# Patient Record
Sex: Male | Born: 1950
Health system: Southern US, Community
[De-identification: ages and names within clinical notes are randomized; demographics above are authoritative.]

## PROBLEM LIST (undated history)

## (undated) DIAGNOSIS — E7800 Pure hypercholesterolemia, unspecified: Secondary | ICD-10-CM

## (undated) DIAGNOSIS — I10 Essential (primary) hypertension: Secondary | ICD-10-CM

## (undated) DIAGNOSIS — E119 Type 2 diabetes mellitus without complications: Secondary | ICD-10-CM

## (undated) HISTORY — PX: OTHER SURGICAL HISTORY: SHX169

## (undated) HISTORY — PX: AORTIC VALVE REPLACEMENT (AVR)/CORONARY ARTERY BYPASS GRAFTING (CABG): SHX5725

---

## 2019-11-20 ENCOUNTER — Ambulatory Visit: Payer: Self-pay | Admitting: Podiatry

## 2019-11-29 ENCOUNTER — Other Ambulatory Visit: Payer: Self-pay

## 2019-11-29 ENCOUNTER — Telehealth: Payer: Self-pay

## 2019-11-29 DIAGNOSIS — Z1211 Encounter for screening for malignant neoplasm of colon: Secondary | ICD-10-CM

## 2019-11-29 MED ORDER — NA SULFATE-K SULFATE-MG SULF 17.5-3.13-1.6 GM/177ML PO SOLN: 1.0000 | Freq: Once | ORAL | 0 refills | Status: AC

## 2019-11-29 NOTE — Telephone Encounter (Signed)
Gastroenterology Pre-Procedure Review  Request Date: 01/12/20 Requesting Physician: Dr. Vicente Males  PATIENT REVIEW QUESTIONS: The patient responded to the following health history questions as indicated:    1. Are you having any GI issues? no 2. Do you have a personal history of Polyps? no 3. Do you have a family history of Colon Cancer or Polyps? no 4. Diabetes Mellitus? yes (type 2 oral meds) 5. Joint replacements in the past 12 months?no 6. Major health problems in the past 3 months?no 7. Any artificial heart valves, MVP, or defibrillator?no    MEDICATIONS & ALLERGIES:    Patient reports the following regarding taking any anticoagulation/antiplatelet therapy:   Plavix, Coumadin, Eliquis, Xarelto, Lovenox, Pradaxa, Brilinta, or Effient? no Aspirin? yes (81 mg daily)  Patient confirms/reports the following medications:  Current Outpatient Medications  Medication Sig Dispense Refill  . aspirin EC 81 MG tablet Take 81 mg by mouth daily.    Marland Kitchen gabapentin (NEURONTIN) 100 MG capsule     . isosorbide mononitrate (IMDUR) 60 MG 24 hr tablet     . metFORMIN (GLUCOPHAGE) 500 MG tablet     . metoprolol tartrate (LOPRESSOR) 25 MG tablet     . Na Sulfate-K Sulfate-Mg Sulf 17.5-3.13-1.6 GM/177ML SOLN Take 1 kit by mouth once for 1 dose. 354 mL 0  . oxyCODONE-acetaminophen (PERCOCET) 10-325 MG tablet     . rOPINIRole (REQUIP) 0.5 MG tablet      No current facility-administered medications for this visit.    Patient confirms/reports the following allergies:  Allergies  Allergen Reactions  . Penicillins     Patient states "Does something to him"    No orders of the defined types were placed in this encounter.   AUTHORIZATION INFORMATION Primary Insurance: 1D#: Group #:  Secondary Insurance: 1D#: Group #:  SCHEDULE INFORMATION: Date: 01/12/20 Time: Location:[facility]

## 2019-12-26 ENCOUNTER — Other Ambulatory Visit: Payer: Self-pay

## 2020-01-10 ENCOUNTER — Telehealth: Payer: Self-pay

## 2020-01-10 NOTE — Telephone Encounter (Signed)
Patients call has been returned.  He inquired on the date for his COVID test.  Patient has been advised that he needs to have his COVID test on Wed 01/24/20 at 10:30am.  Thanks,  Marcelino Duster, CMA

## 2020-01-22 ENCOUNTER — Telehealth: Payer: Self-pay | Admitting: Gastroenterology

## 2020-01-22 MED ORDER — NA SULFATE-K SULFATE-MG SULF 17.5-3.13-1.6 GM/177ML PO SOLN: 1.0000 | Freq: Once | ORAL | 0 refills | Status: AC

## 2020-01-22 NOTE — Telephone Encounter (Signed)
Pt left vm to change his apt to  Monday due to his ride please call pt

## 2020-01-22 NOTE — Telephone Encounter (Signed)
Patients colonoscopy has been rescheduled from 01/26/20 to 02/12/20 with Dr. Tobi Bastos still at [facility].  Pt notified of COVID test date Thursday 02/08/20.   New instructions sent to patient via mail.  Referral updated.  Thanks,  [place], [place]

## 2020-02-08 ENCOUNTER — Other Ambulatory Visit: Payer: Self-pay

## 2020-02-08 ENCOUNTER — Other Ambulatory Visit
Admission: RE | Admit: 2020-02-08 | Discharge: 2020-02-08 | Disposition: A | Discharge status: Home / Self Care | Payer: Medicare HMO | Source: Ambulatory Visit | Attending: Gastroenterology | Admitting: Gastroenterology

## 2020-02-08 DIAGNOSIS — Z01812 Encounter for preprocedural laboratory examination: Secondary | ICD-10-CM | POA: Diagnosis present

## 2020-02-08 DIAGNOSIS — Z20822 Contact with and (suspected) exposure to covid-19: Secondary | ICD-10-CM | POA: Diagnosis not present

## 2020-02-08 LAB — SARS CORONAVIRUS 2 (TAT 6-24 HRS): SARS Coronavirus 2: NEGATIVE

## 2020-02-12 ENCOUNTER — Encounter: Payer: Self-pay | Admitting: Registered Nurse

## 2020-02-12 ENCOUNTER — Ambulatory Visit: Admission: RE | Admit: 2020-02-12 | Payer: Medicare HMO | Source: Home / Self Care | Admitting: Gastroenterology

## 2020-02-12 ENCOUNTER — Encounter: Admission: RE | Payer: Self-pay | Source: Home / Self Care

## 2020-02-12 SURGERY — COLONOSCOPY WITH PROPOFOL
Anesthesia: General

## 2020-02-19 ENCOUNTER — Ambulatory Visit: Payer: Medicare HMO

## 2020-04-04 ENCOUNTER — Other Ambulatory Visit: Payer: Self-pay | Admitting: Anesthesiology

## 2020-04-04 DIAGNOSIS — M5416 Radiculopathy, lumbar region: Secondary | ICD-10-CM

## 2020-04-04 DIAGNOSIS — M5440 Lumbago with sciatica, unspecified side: Secondary | ICD-10-CM

## 2020-04-19 ENCOUNTER — Ambulatory Visit
Admission: RE | Admit: 2020-04-19 | Discharge: 2020-04-19 | Disposition: A | Discharge status: Home / Self Care | Payer: Medicare Other | Source: Ambulatory Visit | Attending: Anesthesiology | Admitting: Anesthesiology

## 2020-04-19 ENCOUNTER — Other Ambulatory Visit: Payer: Self-pay

## 2020-04-19 DIAGNOSIS — M5416 Radiculopathy, lumbar region: Secondary | ICD-10-CM | POA: Insufficient documentation

## 2020-04-19 DIAGNOSIS — M5440 Lumbago with sciatica, unspecified side: Secondary | ICD-10-CM | POA: Insufficient documentation

## 2021-02-03 DIAGNOSIS — E1142 Type 2 diabetes mellitus with diabetic polyneuropathy: Secondary | ICD-10-CM | POA: Diagnosis not present

## 2021-02-03 DIAGNOSIS — E782 Mixed hyperlipidemia: Secondary | ICD-10-CM | POA: Diagnosis not present

## 2021-02-03 DIAGNOSIS — M545 Low back pain, unspecified: Secondary | ICD-10-CM | POA: Diagnosis not present

## 2021-02-03 DIAGNOSIS — M109 Gout, unspecified: Secondary | ICD-10-CM | POA: Diagnosis not present

## 2021-02-03 DIAGNOSIS — I10 Essential (primary) hypertension: Secondary | ICD-10-CM | POA: Diagnosis not present

## 2021-02-03 DIAGNOSIS — E1165 Type 2 diabetes mellitus with hyperglycemia: Secondary | ICD-10-CM | POA: Diagnosis not present

## 2021-02-17 DIAGNOSIS — I509 Heart failure, unspecified: Secondary | ICD-10-CM | POA: Diagnosis not present

## 2021-02-17 DIAGNOSIS — I10 Essential (primary) hypertension: Secondary | ICD-10-CM | POA: Diagnosis not present

## 2021-02-17 DIAGNOSIS — R1084 Generalized abdominal pain: Secondary | ICD-10-CM | POA: Diagnosis not present

## 2021-02-17 DIAGNOSIS — I2510 Atherosclerotic heart disease of native coronary artery without angina pectoris: Secondary | ICD-10-CM | POA: Diagnosis not present

## 2021-02-17 DIAGNOSIS — E1142 Type 2 diabetes mellitus with diabetic polyneuropathy: Secondary | ICD-10-CM | POA: Diagnosis not present

## 2021-03-04 ENCOUNTER — Emergency Department: Payer: Medicare Other

## 2021-03-04 ENCOUNTER — Other Ambulatory Visit: Payer: Self-pay

## 2021-03-04 ENCOUNTER — Encounter
Admission: EM | Disposition: A | Discharge status: Home / Self Care | Payer: Self-pay | Source: Home / Self Care | Attending: Emergency Medicine

## 2021-03-04 ENCOUNTER — Observation Stay
Admission: EM | Admit: 2021-03-04 | Discharge: 2021-03-04 | Disposition: A | Discharge status: Home / Self Care | Payer: Medicare Other | Attending: Internal Medicine | Admitting: Internal Medicine

## 2021-03-04 DIAGNOSIS — I10 Essential (primary) hypertension: Secondary | ICD-10-CM | POA: Diagnosis not present

## 2021-03-04 DIAGNOSIS — E119 Type 2 diabetes mellitus without complications: Secondary | ICD-10-CM | POA: Diagnosis not present

## 2021-03-04 DIAGNOSIS — I200 Unstable angina: Secondary | ICD-10-CM | POA: Diagnosis not present

## 2021-03-04 DIAGNOSIS — R079 Chest pain, unspecified: Secondary | ICD-10-CM

## 2021-03-04 DIAGNOSIS — Z20822 Contact with and (suspected) exposure to covid-19: Secondary | ICD-10-CM | POA: Diagnosis not present

## 2021-03-04 DIAGNOSIS — Z7984 Long term (current) use of oral hypoglycemic drugs: Secondary | ICD-10-CM | POA: Diagnosis not present

## 2021-03-04 DIAGNOSIS — I25110 Atherosclerotic heart disease of native coronary artery with unstable angina pectoris: Principal | ICD-10-CM | POA: Insufficient documentation

## 2021-03-04 DIAGNOSIS — R072 Precordial pain: Secondary | ICD-10-CM | POA: Diagnosis present

## 2021-03-04 DIAGNOSIS — I25700 Atherosclerosis of coronary artery bypass graft(s), unspecified, with unstable angina pectoris: Secondary | ICD-10-CM | POA: Diagnosis not present

## 2021-03-04 DIAGNOSIS — I2510 Atherosclerotic heart disease of native coronary artery without angina pectoris: Secondary | ICD-10-CM | POA: Diagnosis present

## 2021-03-04 DIAGNOSIS — Z79899 Other long term (current) drug therapy: Secondary | ICD-10-CM | POA: Diagnosis not present

## 2021-03-04 DIAGNOSIS — I214 Non-ST elevation (NSTEMI) myocardial infarction: Secondary | ICD-10-CM

## 2021-03-04 DIAGNOSIS — Z951 Presence of aortocoronary bypass graft: Secondary | ICD-10-CM | POA: Insufficient documentation

## 2021-03-04 DIAGNOSIS — Z7982 Long term (current) use of aspirin: Secondary | ICD-10-CM | POA: Diagnosis not present

## 2021-03-04 DIAGNOSIS — E7800 Pure hypercholesterolemia, unspecified: Secondary | ICD-10-CM | POA: Diagnosis present

## 2021-03-04 HISTORY — DX: Pure hypercholesterolemia, unspecified: E78.00

## 2021-03-04 HISTORY — DX: Essential (primary) hypertension: I10

## 2021-03-04 HISTORY — PX: LEFT HEART CATH AND CORS/GRAFTS ANGIOGRAPHY: CATH118250

## 2021-03-04 HISTORY — DX: Type 2 diabetes mellitus without complications: E11.9

## 2021-03-04 LAB — CBC
HCT: 48.9 % (ref 39.0–52.0)
Hemoglobin: 15.8 g/dL (ref 13.0–17.0)
MCH: 26.6 pg (ref 26.0–34.0)
MCHC: 32.3 g/dL (ref 30.0–36.0)
MCV: 82.5 fL (ref 80.0–100.0)
Platelets: 137 10*3/uL — ABNORMAL LOW (ref 150–400)
RBC: 5.93 MIL/uL — ABNORMAL HIGH (ref 4.22–5.81)
RDW: 17.5 % — ABNORMAL HIGH (ref 11.5–15.5)
WBC: 4.3 10*3/uL (ref 4.0–10.5)
nRBC: 0 % (ref 0.0–0.2)

## 2021-03-04 LAB — HEPATIC FUNCTION PANEL
ALT: 24 U/L (ref 0–44)
AST: 27 U/L (ref 15–41)
Albumin: 4.6 g/dL (ref 3.5–5.0)
Alkaline Phosphatase: 48 U/L (ref 38–126)
Bilirubin, Direct: 0.1 mg/dL (ref 0.0–0.2)
Indirect Bilirubin: 0.8 mg/dL (ref 0.3–0.9)
Total Bilirubin: 0.9 mg/dL (ref 0.3–1.2)
Total Protein: 8.2 g/dL — ABNORMAL HIGH (ref 6.5–8.1)

## 2021-03-04 LAB — PROTIME-INR
INR: 1 (ref 0.8–1.2)
Prothrombin Time: 13 seconds (ref 11.4–15.2)

## 2021-03-04 LAB — RESP PANEL BY RT-PCR (FLU A&B, COVID) ARPGX2
Influenza A by PCR: NEGATIVE
Influenza B by PCR: NEGATIVE
SARS Coronavirus 2 by RT PCR: NEGATIVE

## 2021-03-04 LAB — APTT: aPTT: 30 seconds (ref 24–36)

## 2021-03-04 LAB — BASIC METABOLIC PANEL
Anion gap: 9 (ref 5–15)
BUN: 16 mg/dL (ref 8–23)
CO2: 24 mmol/L (ref 22–32)
Calcium: 9.7 mg/dL (ref 8.9–10.3)
Chloride: 107 mmol/L (ref 98–111)
Creatinine, Ser: 1.25 mg/dL — ABNORMAL HIGH (ref 0.61–1.24)
GFR, Estimated: >60 mL/min (ref >60)
Glucose, Bld: 168 mg/dL — ABNORMAL HIGH (ref 70–99)
Potassium: 4 mmol/L (ref 3.5–5.1)
Sodium: 140 mmol/L (ref 135–145)

## 2021-03-04 LAB — TROPONIN I (HIGH SENSITIVITY)
Troponin I (High Sensitivity): 18 ng/L — ABNORMAL HIGH (ref <18)
Troponin I (High Sensitivity): 16 ng/L (ref <18)

## 2021-03-04 LAB — CBG MONITORING, ED: Glucose-Capillary: 102 mg/dL — ABNORMAL HIGH (ref 70–99)

## 2021-03-04 LAB — HEMOGLOBIN A1C
Hgb A1c MFr Bld: 7.1 % — ABNORMAL HIGH (ref 4.8–5.6)
Mean Plasma Glucose: 157.07 mg/dL

## 2021-03-04 LAB — LIPASE, BLOOD: Lipase: 50 U/L (ref 11–51)

## 2021-03-04 LAB — GLUCOSE, CAPILLARY: Glucose-Capillary: 93 mg/dL (ref 70–99)

## 2021-03-04 SURGERY — LEFT HEART CATH AND CORS/GRAFTS ANGIOGRAPHY
Anesthesia: Moderate Sedation | Laterality: Right

## 2021-03-04 MED ORDER — HEPARIN (PORCINE) IN NACL 1000-0.9 UT/500ML-% IV SOLN
INTRAVENOUS | Status: DC | PRN
  Administered 2021-03-04 (×2): 500 mL

## 2021-03-04 MED ORDER — NITROGLYCERIN 0.4 MG SL SUBL
0.4000 mg | SUBLINGUAL_TABLET | SUBLINGUAL | Status: DC | PRN
  Administered 2021-03-04 (×3): 0.4 mg via SUBLINGUAL
  Filled 2021-03-04 (×3): qty 1

## 2021-03-04 MED ORDER — GABAPENTIN 100 MG PO CAPS: 100.0000 mg | ORAL_CAPSULE | Freq: Every day | ORAL | Status: DC

## 2021-03-04 MED ORDER — SODIUM CHLORIDE 0.9% FLUSH
3.0000 mL | Freq: Two times a day (BID) | INTRAVENOUS | Status: DC
  Administered 2021-03-04: 3 mL via INTRAVENOUS

## 2021-03-04 MED ORDER — COLCHICINE 0.6 MG PO TABS
0.6000 mg | ORAL_TABLET | Freq: Every day | ORAL | Status: DC
  Filled 2021-03-04: qty 1

## 2021-03-04 MED ORDER — SODIUM CHLORIDE 0.9% FLUSH: 3.0000 mL | Freq: Two times a day (BID) | INTRAVENOUS | Status: DC

## 2021-03-04 MED ORDER — HEPARIN (PORCINE) 25000 UT/250ML-% IV SOLN
1300.0000 [IU]/h | INTRAVENOUS | Status: DC
  Administered 2021-03-04: 1300 [IU]/h via INTRAVENOUS
  Filled 2021-03-04: qty 250

## 2021-03-04 MED ORDER — ASPIRIN 300 MG RE SUPP: 300.0000 mg | RECTAL | Status: DC

## 2021-03-04 MED ORDER — SODIUM CHLORIDE 0.9% FLUSH: 3.0000 mL | INTRAVENOUS | Status: DC | PRN

## 2021-03-04 MED ORDER — HYDRALAZINE HCL 20 MG/ML IJ SOLN
10.0000 mg | INTRAMUSCULAR | Status: DC | PRN
Start: 2021-03-04 — End: 2021-03-04

## 2021-03-04 MED ORDER — LABETALOL HCL 5 MG/ML IV SOLN: 10.0000 mg | INTRAVENOUS | Status: DC | PRN

## 2021-03-04 MED ORDER — SODIUM CHLORIDE 0.9 % IV SOLN: 250.0000 mL | INTRAVENOUS | Status: DC | PRN

## 2021-03-04 MED ORDER — ACETAMINOPHEN 325 MG PO TABS: 650.0000 mg | ORAL_TABLET | ORAL | Status: DC | PRN

## 2021-03-04 MED ORDER — SODIUM CHLORIDE 0.9 % IV SOLN: INTRAVENOUS | Status: DC

## 2021-03-04 MED ORDER — ASPIRIN EC 81 MG PO TBEC: 81.0000 mg | DELAYED_RELEASE_TABLET | Freq: Every day | ORAL | Status: DC

## 2021-03-04 MED ORDER — SODIUM CHLORIDE 0.9 % WEIGHT BASED INFUSION: 1.0000 mL/kg/h | INTRAVENOUS | Status: DC

## 2021-03-04 MED ORDER — ALLOPURINOL 100 MG PO TABS
100.0000 mg | ORAL_TABLET | Freq: Every day | ORAL | Status: DC
  Filled 2021-03-04: qty 1

## 2021-03-04 MED ORDER — HEPARIN BOLUS VIA INFUSION
4000.0000 [IU] | Freq: Once | INTRAVENOUS | Status: AC
  Administered 2021-03-04: 4000 [IU] via INTRAVENOUS
  Filled 2021-03-04: qty 4000

## 2021-03-04 MED ORDER — INSULIN ASPART 100 UNIT/ML IJ SOLN: 0.0000 [IU] | INTRAMUSCULAR | Status: DC

## 2021-03-04 MED ORDER — FENTANYL CITRATE (PF) 100 MCG/2ML IJ SOLN
INTRAMUSCULAR | Status: DC | PRN
  Administered 2021-03-04: 50 ug via INTRAVENOUS

## 2021-03-04 MED ORDER — LIDOCAINE HCL (PF) 1 % IJ SOLN
INTRAMUSCULAR | Status: AC
  Filled 2021-03-04: qty 30

## 2021-03-04 MED ORDER — ASPIRIN 81 MG PO CHEW: 324.0000 mg | CHEWABLE_TABLET | ORAL | Status: DC

## 2021-03-04 MED ORDER — MIDAZOLAM HCL 2 MG/2ML IJ SOLN
INTRAMUSCULAR | Status: AC
  Filled 2021-03-04: qty 2

## 2021-03-04 MED ORDER — ASPIRIN 81 MG PO CHEW: 81.0000 mg | CHEWABLE_TABLET | ORAL | Status: DC

## 2021-03-04 MED ORDER — IOHEXOL 300 MG/ML  SOLN
INTRAMUSCULAR | Status: DC | PRN
Start: 2021-03-04 — End: 2021-03-04
  Administered 2021-03-04: 133 mL

## 2021-03-04 MED ORDER — ISOSORBIDE MONONITRATE 20 MG PO TABS
20.0000 mg | ORAL_TABLET | Freq: Once | ORAL | Status: AC
  Administered 2021-03-04: 20 mg via ORAL
  Filled 2021-03-04: qty 1

## 2021-03-04 MED ORDER — ONDANSETRON HCL 4 MG/2ML IJ SOLN: 4.0000 mg | Freq: Four times a day (QID) | INTRAMUSCULAR | Status: DC | PRN

## 2021-03-04 MED ORDER — ROSUVASTATIN CALCIUM 20 MG PO TABS
40.0000 mg | ORAL_TABLET | Freq: Every day | ORAL | Status: DC
  Filled 2021-03-04: qty 2

## 2021-03-04 MED ORDER — TICAGRELOR 90 MG PO TABS
180.0000 mg | ORAL_TABLET | Freq: Once | ORAL | Status: AC
  Administered 2021-03-04: 180 mg via ORAL
  Filled 2021-03-04: qty 2

## 2021-03-04 MED ORDER — HEPARIN (PORCINE) IN NACL 1000-0.9 UT/500ML-% IV SOLN
INTRAVENOUS | Status: AC
  Filled 2021-03-04: qty 1000

## 2021-03-04 MED ORDER — TICAGRELOR 90 MG PO TABS: 90.0000 mg | ORAL_TABLET | Freq: Two times a day (BID) | ORAL | Status: DC

## 2021-03-04 MED ORDER — ICOSAPENT ETHYL 1 G PO CAPS
2.0000 g | ORAL_CAPSULE | Freq: Two times a day (BID) | ORAL | Status: DC
  Filled 2021-03-04 (×2): qty 2

## 2021-03-04 MED ORDER — ASPIRIN 81 MG PO CHEW
324.0000 mg | CHEWABLE_TABLET | Freq: Once | ORAL | Status: AC
  Administered 2021-03-04: 324 mg via ORAL
  Filled 2021-03-04: qty 4

## 2021-03-04 MED ORDER — ROPINIROLE HCL 1 MG PO TABS: 0.5000 mg | ORAL_TABLET | Freq: Three times a day (TID) | ORAL | Status: DC

## 2021-03-04 MED ORDER — LIDOCAINE HCL (PF) 1 % IJ SOLN
INTRAMUSCULAR | Status: DC | PRN
  Administered 2021-03-04: 20 mL

## 2021-03-04 MED ORDER — NITROGLYCERIN 0.4 MG SL SUBL: 0.4000 mg | SUBLINGUAL_TABLET | SUBLINGUAL | Status: DC | PRN

## 2021-03-04 MED ORDER — METOPROLOL TARTRATE 25 MG PO TABS: 25.0000 mg | ORAL_TABLET | Freq: Two times a day (BID) | ORAL | Status: DC

## 2021-03-04 MED ORDER — ISOSORBIDE MONONITRATE ER 60 MG PO TB24: 60.0000 mg | ORAL_TABLET | Freq: Every day | ORAL | Status: DC

## 2021-03-04 MED ORDER — PANTOPRAZOLE SODIUM 40 MG PO TBEC: 40.0000 mg | DELAYED_RELEASE_TABLET | Freq: Every day | ORAL | Status: DC

## 2021-03-04 MED ORDER — FENTANYL CITRATE (PF) 100 MCG/2ML IJ SOLN
INTRAMUSCULAR | Status: AC
  Filled 2021-03-04: qty 2

## 2021-03-04 MED ORDER — MIDAZOLAM HCL 2 MG/2ML IJ SOLN
INTRAMUSCULAR | Status: DC | PRN
Start: 2021-03-04 — End: 2021-03-04
  Administered 2021-03-04: 1 mg via INTRAVENOUS

## 2021-03-04 SURGICAL SUPPLY — 14 items
CATH ANGIO 5F JB2 100CM (CATHETERS) ×3 IMPLANT
CATH INFINITI 5 FR IM (CATHETERS) ×3 IMPLANT
CATH INFINITI 5FR ANG PIGTAIL (CATHETERS) ×3 IMPLANT
CATH INFINITI 5FR JL4 (CATHETERS) ×3 IMPLANT
CATH INFINITI JR4 5F (CATHETERS) ×3 IMPLANT
DEVICE CLOSURE MYNXGRIP 5F (Vascular Products) ×3 IMPLANT
NEEDLE PERC 18GX7CM (NEEDLE) ×3 IMPLANT
PACK CARDIAC CATH (CUSTOM PROCEDURE TRAY) ×3 IMPLANT
PROTECTION STATION PRESSURIZED (MISCELLANEOUS) ×3
SET ATX SIMPLICITY (MISCELLANEOUS) ×3 IMPLANT
SHEATH AVANTI 5FR X 11CM (SHEATH) ×3 IMPLANT
STATION PROTECTION PRESSURIZED (MISCELLANEOUS) ×2 IMPLANT
WIRE EMERALD 3MM-J .035X260CM (WIRE) ×3 IMPLANT
WIRE GUIDERIGHT .035X150 (WIRE) ×3 IMPLANT

## 2021-03-04 NOTE — ED Notes (Signed)
MD at bedside. Discussing lab results with patient.

## 2021-03-04 NOTE — ED Notes (Signed)
Cardiac cath consent (paper copy) completed with pt at this time and placed in pt chart at this time.

## 2021-03-04 NOTE — ED Notes (Signed)
Called lab to add on labs to already drawn blue top

## 2021-03-04 NOTE — Consult Note (Signed)
ANTICOAGULATION CONSULT NOTE - Initial Consult  Pharmacy Consult for Heparin Infusion Indication: chest pain/ACS  Allergies  Allergen Reactions  . Penicillins     Patient states "Does something to him"    Patient Measurements: Height: 6\' 1"  (185.4 cm) Weight: 104.3 kg (230 lb) IBW/kg (Calculated) : 79.9 Heparin Dosing Weight: 101.2 kg  Vital Signs: Temp: 97.7 F (36.5 C) (05/03 0715) BP: 118/76 (05/03 0830) Pulse Rate: 57 (05/03 0830)  Labs: Recent Labs    03/04/21 0718 03/04/21 0719 03/04/21 0856  HGB 15.8  --   --   HCT 48.9  --   --   PLT 137*  --   --   APTT  --  30  --   LABPROT  --  13.0  --   INR  --  1.0  --   CREATININE 1.25*  --   --   TROPONINIHS 18*  --  16    Estimated Creatinine Clearance: 68.8 mL/min (A) (by C-G formula based on SCr of 1.25 mg/dL (H)).   Medical History: Past Medical History:  Diagnosis Date  . Diabetes mellitus without complication (HCC)   . Hypercholesteremia   . Hypertension     Pertinent Medications:  No history of anticoagulant use PTA   Assessment: Pharmacy has been consulted to initiate heparin infusion in 69yo patient with history of CAD s/p CABG in 2002, last cath was in 2013 presenting to ED with chest pain. No prior history of anticoagulant use PTA. Baseline labs: Plts 137(unclear baseline), Hgb 15.8, INR 1.0, aPTT 30sec. Will initiate drip immediately  Goal of Therapy:  Heparin level 0.3-0.7 units/ml Monitor platelets by anticoagulation protocol: Yes   Plan:  Give 4000 units bolus x 1 Start heparin infusion at 1300 units/hr Check anti-Xa level in 8 hours and daily while on heparin Continue to monitor H&H and platelets  Kevontay Burks A Asiel Chrostowski 03/04/2021,10:01 AM

## 2021-03-04 NOTE — ED Notes (Signed)
Called lab to add on HA1C to previously sent labs. Per Marylene Land, will add on
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
## 2021-03-04 NOTE — Progress Notes (Signed)
Medical treatment, and can go home with f/u office Thursday 9 am. See cath report.

## 2021-03-04 NOTE — ED Notes (Signed)
Messaged MD Agbata about PO meds. She advised he has had what he needs in the ER. The rest of the PO meds can wait.
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
## 2021-03-04 NOTE — ED Triage Notes (Addendum)
Pt comes with c/o left sided CP that radiates to left arm. Pt states this started this am. Pt denies any SOB, N.V/D.  Pt has hx of CABG in 2002.  Pt took 1 nitro with little relief.
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
## 2021-03-04 NOTE — ED Notes (Signed)
Pt given cup of water per request. Confirmed with MD Erma Heritage pt able to drink a little water at this time.

## 2021-03-04 NOTE — ED Notes (Signed)
Report given to specials at this time

## 2021-03-04 NOTE — Discharge Instructions (Signed)
Angiogram, Care After This sheet gives you information about how to care for yourself after your procedure. Your health care provider may also give you more specific instructions. If you have problems or questions, contact your health care provider. What can I expect after the procedure? After the procedure, it is common to have:  Bruising and tenderness at the catheter insertion area.  A collection of blood (hematoma) at the insertion area. This may feel like a small lump under the skin at the insertion site. Follow these instructions at home: Insertion site care  Follow instructions from your health care provider about how to take care of your insertion site. Make sure you: ? Wash your hands with soap and water before and after you change your bandage (dressing). If soap and water are not available, use hand sanitizer. ? Change your dressing as told by your health care provider.  Do not take baths, swim, or use a hot tub until your health care provider approves.  You may shower 24-48 hours after the procedure, or as told by your health care provider. To clean the insertion site: ? Gently wash the area with plain soap and water. ? Pat the area dry with a clean towel. ? Do not rub the site. This may cause bleeding.  Check your insertion site every day for signs of infection. Check for: ? Redness, swelling, or pain. ? Fluid or blood. ? Warmth. ? Pus or a bad smell.  Do not apply powder or lotion to the site. Keep the site clean and dry.   Activity  Do not drive for 24 hours if you were given a sedative during your procedure.  Rest as told by your health care provider, usually for 1-2 days.  Do not lift anything that is heavier than 10 lb (4.5 kg), or the limit that you are told, until your health care provider says that it is safe.  If the insertion site was in your leg, try to avoid stairs for a few days.  Return to your normal activities as told by your health care provider,  usually in about a week. Ask your health care provider what activities are safe for you. General instructions  If your insertion site starts bleeding, lie flat and put pressure on the site. If the bleeding does not stop, get help right away. This is a medical emergency.  Take over-the-counter and prescription medicines only as told by your health care provider.  Drink enough fluid to keep your urine pale yellow. This helps flush the contrast dye from your body.  Keep all follow-up visits as told by your health care provider. This is important.   Contact a health care provider if:  You have a fever or chills.  You have redness, swelling, or pain around your insertion site.  You have fluid or blood coming from your insertion site.  Your insertion site feels warm to the touch.  You have pus or a bad smell coming from your insertion site.  You have more bruising around the insertion site. Get help right away if you have:  A problem with the insertion area, such as: ? The area swells fast or bleeds even after you apply pressure. ? The area becomes pale, cool, tingly, or numb.  Chest pain.  Trouble breathing.  A rash.  Any symptoms of a stroke. "BE FAST" is an easy way to remember the main warning signs of a stroke: ? B - Balance. Signs are dizziness, sudden trouble walking,   or loss of balance. ? E - Eyes. Signs are trouble seeing or a sudden change in vision. ? F - Face. Signs are sudden weakness or loss of feeling of the face, or the face or eyelid drooping on one side. ? A - Arms. Signs are weakness or loss of feeling in an arm. This happens suddenly and usually on one side of the body. ? S - Speech. Signs are sudden trouble speaking, slurred speech, or trouble understanding what people say. ? T - Time. Time to call emergency services. Write down what time symptoms started.  You have other signs of a stroke, such as: ? A sudden, severe headache with no known cause. ? Nausea  or vomiting. ? Seizure. These symptoms may represent a serious problem that is an emergency. Do not wait to see if the symptoms will go away. Get medical help right away. Call your local emergency services (911 in the [place] Do not drive yourself to the hospital. Summary  It is common to have bruising and tenderness at the catheter insertion area.  Do not take baths, swim, or use a hot tub until your health care provider approves. You may shower 24-48 hours after the procedure or as told.  It is important to rest and drink plenty of fluids.  If the insertion site bleeds, lie flat and put pressure on the site. If the bleeding continues, get help right away. This is a medical emergency. This information is not intended to replace advice given to you by your health care provider. Make sure you discuss any questions you have with your health care provider. Document Revised: 08/23/2019 Document Reviewed: 08/23/2019 Elsevier Patient Education  2021 [employer] Resume Metformin on Friday 03/07/21 Check blood sugars daily Return to emergency room for worsening chest pain Keep scheduled follow-up appointment with cardiologist

## 2021-03-05 ENCOUNTER — Encounter: Payer: Self-pay | Admitting: Cardiovascular Disease

## 2021-03-06 DIAGNOSIS — R0602 Shortness of breath: Secondary | ICD-10-CM | POA: Diagnosis not present

## 2021-03-06 DIAGNOSIS — I2510 Atherosclerotic heart disease of native coronary artery without angina pectoris: Secondary | ICD-10-CM | POA: Diagnosis not present

## 2021-03-06 DIAGNOSIS — E782 Mixed hyperlipidemia: Secondary | ICD-10-CM | POA: Diagnosis not present

## 2021-03-06 DIAGNOSIS — I10 Essential (primary) hypertension: Secondary | ICD-10-CM | POA: Diagnosis not present

## 2021-03-06 DIAGNOSIS — I509 Heart failure, unspecified: Secondary | ICD-10-CM | POA: Diagnosis not present

## 2021-04-14 DIAGNOSIS — I2510 Atherosclerotic heart disease of native coronary artery without angina pectoris: Secondary | ICD-10-CM | POA: Diagnosis not present

## 2021-04-14 DIAGNOSIS — I10 Essential (primary) hypertension: Secondary | ICD-10-CM | POA: Diagnosis not present

## 2021-04-14 DIAGNOSIS — E785 Hyperlipidemia, unspecified: Secondary | ICD-10-CM | POA: Diagnosis not present

## 2021-04-14 DIAGNOSIS — G2581 Restless legs syndrome: Secondary | ICD-10-CM | POA: Diagnosis not present

## 2021-04-14 DIAGNOSIS — E119 Type 2 diabetes mellitus without complications: Secondary | ICD-10-CM | POA: Diagnosis not present

## 2021-04-14 DIAGNOSIS — M159 Polyosteoarthritis, unspecified: Secondary | ICD-10-CM | POA: Diagnosis not present

## 2021-04-14 DIAGNOSIS — E1142 Type 2 diabetes mellitus with diabetic polyneuropathy: Secondary | ICD-10-CM | POA: Diagnosis not present

## 2021-04-14 DIAGNOSIS — Z79899 Other long term (current) drug therapy: Secondary | ICD-10-CM | POA: Diagnosis not present

## 2021-05-15 DIAGNOSIS — Z7189 Other specified counseling: Secondary | ICD-10-CM | POA: Diagnosis not present

## 2021-05-15 DIAGNOSIS — E785 Hyperlipidemia, unspecified: Secondary | ICD-10-CM | POA: Diagnosis not present

## 2021-05-15 DIAGNOSIS — E1142 Type 2 diabetes mellitus with diabetic polyneuropathy: Secondary | ICD-10-CM | POA: Diagnosis not present

## 2021-05-15 DIAGNOSIS — M549 Dorsalgia, unspecified: Secondary | ICD-10-CM | POA: Diagnosis not present

## 2021-05-15 DIAGNOSIS — M159 Polyosteoarthritis, unspecified: Secondary | ICD-10-CM | POA: Diagnosis not present

## 2021-05-15 DIAGNOSIS — Z0001 Encounter for general adult medical examination with abnormal findings: Secondary | ICD-10-CM | POA: Diagnosis not present

## 2021-05-15 DIAGNOSIS — I10 Essential (primary) hypertension: Secondary | ICD-10-CM | POA: Diagnosis not present

## 2021-05-15 DIAGNOSIS — I2510 Atherosclerotic heart disease of native coronary artery without angina pectoris: Secondary | ICD-10-CM | POA: Diagnosis not present

## 2021-05-15 DIAGNOSIS — G8929 Other chronic pain: Secondary | ICD-10-CM | POA: Diagnosis not present

## 2021-05-15 DIAGNOSIS — G2581 Restless legs syndrome: Secondary | ICD-10-CM | POA: Diagnosis not present

## 2021-05-15 DIAGNOSIS — Z1389 Encounter for screening for other disorder: Secondary | ICD-10-CM | POA: Diagnosis not present

## 2021-05-30 DIAGNOSIS — H43813 Vitreous degeneration, bilateral: Secondary | ICD-10-CM | POA: Diagnosis not present

## 2021-05-30 DIAGNOSIS — H11133 Conjunctival pigmentations, bilateral: Secondary | ICD-10-CM | POA: Diagnosis not present

## 2021-05-30 DIAGNOSIS — H11153 Pinguecula, bilateral: Secondary | ICD-10-CM | POA: Diagnosis not present

## 2021-05-30 DIAGNOSIS — H40013 Open angle with borderline findings, low risk, bilateral: Secondary | ICD-10-CM | POA: Diagnosis not present

## 2021-05-30 DIAGNOSIS — E113293 Type 2 diabetes mellitus with mild nonproliferative diabetic retinopathy without macular edema, bilateral: Secondary | ICD-10-CM | POA: Diagnosis not present

## 2021-05-30 DIAGNOSIS — H40053 Ocular hypertension, bilateral: Secondary | ICD-10-CM | POA: Diagnosis not present
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
